# Patient Record
Sex: Female | Born: 2000 | Race: Black or African American | Hispanic: No | Marital: Single | Smoking: Current every day smoker
Health system: Southern US, Community
[De-identification: ages and names within clinical notes are randomized; demographics above are authoritative.]

---
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
## 2018-05-09 ENCOUNTER — Emergency Department
Admission: EM | Admit: 2018-05-09 | Discharge: 2018-05-09 | Disposition: A | Discharge status: Home / Self Care | Payer: Medicaid Other | Attending: Emergency Medicine | Admitting: Emergency Medicine

## 2018-05-09 ENCOUNTER — Other Ambulatory Visit: Payer: Self-pay

## 2018-05-09 ENCOUNTER — Emergency Department: Payer: Medicaid Other

## 2018-05-09 DIAGNOSIS — Y9389 Activity, other specified: Secondary | ICD-10-CM | POA: Insufficient documentation

## 2018-05-09 DIAGNOSIS — W2209XA Striking against other stationary object, initial encounter: Secondary | ICD-10-CM | POA: Insufficient documentation

## 2018-05-09 DIAGNOSIS — Y929 Unspecified place or not applicable: Secondary | ICD-10-CM | POA: Insufficient documentation

## 2018-05-09 DIAGNOSIS — Y999 Unspecified external cause status: Secondary | ICD-10-CM | POA: Insufficient documentation

## 2018-05-09 DIAGNOSIS — S60221A Contusion of right hand, initial encounter: Secondary | ICD-10-CM | POA: Insufficient documentation

## 2018-05-09 DIAGNOSIS — S60511A Abrasion of right hand, initial encounter: Secondary | ICD-10-CM | POA: Insufficient documentation

## 2018-05-09 DIAGNOSIS — F17290 Nicotine dependence, other tobacco product, uncomplicated: Secondary | ICD-10-CM | POA: Insufficient documentation

## 2018-05-09 MED ORDER — BACITRACIN-NEOMYCIN-POLYMYXIN 400-5-5000 EX OINT
TOPICAL_OINTMENT | Freq: Once | CUTANEOUS | Status: AC
  Administered 2018-05-09: 1 via TOPICAL
  Filled 2018-05-09: qty 1

## 2018-05-09 NOTE — Discharge Instructions (Signed)
Please follow-up with primary care provider of your choice for symptoms that are not improving over the next few days. Apply ice to your hand 20 minutes/h while awake. Wear the splint for comfort. Take Tylenol or ibuprofen for pain. Return to the emergency department for symptoms of change or worsen if unable to schedule appointment.

## 2018-05-09 NOTE — ED Triage Notes (Signed)
First Nurse: patient brought in by ems from home. Per ems patient punched a wall twice tonight. Patient now with pain and swelling to right hand.

## 2018-05-09 NOTE — ED Triage Notes (Signed)
Pt states she punched a wall pta. Pt with right hand swelling, pain and abrasions noted. Ice applied in triage. Cms intact to fingers.

## 2018-05-09 NOTE — ED Provider Notes (Signed)
[facility] Emergency Department Provider Note ____________________________________________  Time seen: Approximately 10:39 PM  I have reviewed the triage vital signs and the nursing notes.   HISTORY  Chief Complaint Hand Injury    HPI Joyce Newton is a 18 y.o. female who presents to the emergency department for evaluation and treatment of right hand pain.  She punched a wall prior to arrival.  Ice applied but no other alleviating measures attempted.  No past medical history on file.  There are no active problems to display for this patient.   No past surgical history on file.  Prior to Admission medications   Medication Sig Start Date End Date Taking? Authorizing Provider  amoxicillin (AMOXIL) 500 MG tablet Take 1 tablet (500 mg total) by mouth 2 (two) times daily. 11/27/16   Little, Traci M, PA-C  brompheniramine-pseudoephedrine-DM 30-2-10 MG/5ML syrup Take 5 mLs by mouth 4 (four) times daily as needed. 12/20/16   Joni Reining, PA-C    Allergies Patient has no known allergies.  No family history on file.  Social History Social History   Tobacco Use  . Smoking status: Current Every Day Smoker    Types: Cigars  Substance Use Topics  . Alcohol use: Not on file  . Drug use: Not on file    Review of Systems Constitutional: Negative for fever. Cardiovascular: Negative for chest pain. Respiratory: Negative for shortness of breath. Musculoskeletal: Positive for right hand pain. Skin: Positive for abrasions to the right hand.  Neurological: Negative for decrease in sensation  ____________________________________________   PHYSICAL EXAM:  VITAL SIGNS: ED Triage Vitals [05/09/18 2059]  Enc Vitals Group     BP 133/70     Pulse Rate 97     Resp 16     Temp 98.5 F (36.9 C)     Temp Source Oral     SpO2 100 %     Weight 294 lb (133.4 kg)     Height 5\' 11"  (1.803 m)     Head Circumference      Peak Flow      Pain Score 10     Pain  Loc      Pain Edu?      Excl. in GC?     Constitutional: Alert and oriented. Well appearing and in no acute distress. Eyes: Conjunctivae are clear without discharge or drainage Head: Atraumatic Neck: Supple Respiratory: No cough. Respirations are even and unlabored. Musculoskeletal: Diffuse right hand pain. FROM of fingers. No tenderness over the right wrist.  Neurologic: Motor and sensory function is intact.  Skin: Abrasion to the right thumb and index finger.  Psychiatric: Affect and behavior are appropriate.  ____________________________________________   LABS (all labs ordered are listed, but only abnormal results are displayed)  Labs Reviewed - No data to display ____________________________________________  RADIOLOGY  Image of the right hand is negative for acute bony abnormality per radiology. ____________________________________________   PROCEDURES  .Splint Application Date/Time: 05/10/2018 12:05 AM Performed by: Chinita Pester, FNP Authorized by: Chinita Pester, FNP   Consent:    Consent obtained:  Verbal   Consent given by:  Patient Pre-procedure details:    Sensation:  Normal Procedure details:    Laterality:  Right   Location:  Hand   Supplies:  Prefabricated splint (cock up splint) Post-procedure details:    Pain:  Unchanged   Sensation:  Normal   Patient tolerance of procedure:  Tolerated well, no immediate complications    ____________________________________________  INITIAL IMPRESSION / ASSESSMENT AND PLAN / ED COURSE  Joyce Newton is a 18 y.o. who presents to the emergency department for treatment and evaluation after punching a wall prior to arrival.  X-rays negative for acute bony abnormality.  She was encouraged to take Tylenol or ibuprofen for pain if needed.  Wound care was provided and she was advised to continue to apply Neosporin to the abrasions on her hand.  She is to follow-up with primary care provider for choice for symptoms  that are not improving over the next few days.  She was given a hand/wrist splint just for comfort.  She is to return to the emergency department for symptoms of change or worsen if unable to schedule an appointment.  Medications  neomycin-bacitracin-polymyxin (NEOSPORIN) ointment packet (1 application Topical Given 05/09/18 2300)    Pertinent labs & imaging results that were available during my care of the patient were reviewed by me and considered in my medical decision making (see chart for details).  _________________________________________   FINAL CLINICAL IMPRESSION(S) / ED DIAGNOSES  Final diagnoses:  Contusion of right hand, initial encounter  Abrasion of right hand, initial encounter    ED Discharge Orders    None       If controlled substance prescribed during this visit, 12 month history viewed on the [facility] prior to issuing an initial prescription for Schedule II or III opiod.    Chinita Pesterriplett, Leenah Seidner B, FNP 05/10/18 0008    Rockne MenghiniNorman, Anne-Caroline, MD 05/13/18 1251

## 2018-05-09 NOTE — ED Notes (Signed)
Patient transported to X-ray 

## 2019-08-31 ENCOUNTER — Encounter: Payer: Self-pay | Admitting: Emergency Medicine

## 2019-08-31 ENCOUNTER — Other Ambulatory Visit: Payer: Self-pay

## 2019-08-31 ENCOUNTER — Emergency Department
Admission: EM | Admit: 2019-08-31 | Discharge: 2019-08-31 | Disposition: A | Discharge status: Home / Self Care | Payer: Medicaid Other | Attending: Emergency Medicine | Admitting: Emergency Medicine

## 2019-08-31 ENCOUNTER — Emergency Department: Payer: Medicaid Other

## 2019-08-31 DIAGNOSIS — Z20822 Contact with and (suspected) exposure to covid-19: Secondary | ICD-10-CM | POA: Insufficient documentation

## 2019-08-31 DIAGNOSIS — R059 Cough, unspecified: Secondary | ICD-10-CM

## 2019-08-31 DIAGNOSIS — R0602 Shortness of breath: Secondary | ICD-10-CM | POA: Insufficient documentation

## 2019-08-31 DIAGNOSIS — R112 Nausea with vomiting, unspecified: Secondary | ICD-10-CM | POA: Insufficient documentation

## 2019-08-31 DIAGNOSIS — F17200 Nicotine dependence, unspecified, uncomplicated: Secondary | ICD-10-CM | POA: Insufficient documentation

## 2019-08-31 DIAGNOSIS — R05 Cough: Secondary | ICD-10-CM | POA: Insufficient documentation

## 2019-08-31 LAB — BASIC METABOLIC PANEL
Anion gap: 9 (ref 5–15)
BUN: 10 mg/dL (ref 6–20)
CO2: 24 mmol/L (ref 22–32)
Calcium: 9.1 mg/dL (ref 8.9–10.3)
Chloride: 105 mmol/L (ref 98–111)
Creatinine, Ser: 0.81 mg/dL (ref 0.44–1.00)
GFR calc Af Amer: >60 mL/min (ref >60)
GFR calc non Af Amer: >60 mL/min (ref >60)
Glucose, Bld: 87 mg/dL (ref 70–99)
Potassium: 4 mmol/L (ref 3.5–5.1)
Sodium: 138 mmol/L (ref 135–145)

## 2019-08-31 LAB — CBC
HCT: 40.1 % (ref 36.0–46.0)
Hemoglobin: 13.3 g/dL (ref 12.0–15.0)
MCH: 29.2 pg (ref 26.0–34.0)
MCHC: 33.2 g/dL (ref 30.0–36.0)
MCV: 88.1 fL (ref 80.0–100.0)
Platelets: 380 10*3/uL (ref 150–400)
RBC: 4.55 MIL/uL (ref 3.87–5.11)
RDW: 12.6 % (ref 11.5–15.5)
WBC: 7.9 10*3/uL (ref 4.0–10.5)
nRBC: 0 % (ref 0.0–0.2)

## 2019-08-31 LAB — TROPONIN I (HIGH SENSITIVITY)
Troponin I (High Sensitivity): <2 ng/L (ref <18)
Troponin I (High Sensitivity): <2 ng/L (ref <18)

## 2019-08-31 LAB — POCT PREGNANCY, URINE: Preg Test, Ur: NEGATIVE

## 2019-08-31 LAB — POC URINE PREG, ED: Preg Test, Ur: NEGATIVE

## 2019-08-31 LAB — POC SARS CORONAVIRUS 2 AG -  ED: SARS Coronavirus 2 Ag: NEGATIVE

## 2019-08-31 MED ORDER — ACETAMINOPHEN-CODEINE 120-12 MG/5ML PO SOLN
5.0000 mL | Freq: Once | ORAL | Status: AC
  Administered 2019-08-31: 5 mL via ORAL
  Filled 2019-08-31: qty 5

## 2019-08-31 MED ORDER — ONDANSETRON 4 MG PO TBDP
4.0000 mg | ORAL_TABLET | Freq: Once | ORAL | Status: AC
  Administered 2019-08-31: 4 mg via ORAL
  Filled 2019-08-31: qty 1

## 2019-08-31 MED ORDER — IBUPROFEN 600 MG PO TABS
600.0000 mg | ORAL_TABLET | Freq: Once | ORAL | Status: AC
  Administered 2019-08-31: 600 mg via ORAL
  Filled 2019-08-31: qty 1

## 2019-08-31 MED ORDER — GUAIFENESIN-DM 100-10 MG/5ML PO SYRP
5.0000 mL | ORAL_SOLUTION | ORAL | 0 refills | Status: DC | PRN
Start: 2019-08-31 — End: 2021-01-01

## 2019-08-31 MED ORDER — BENZONATATE 100 MG PO CAPS
100.0000 mg | ORAL_CAPSULE | Freq: Once | ORAL | Status: AC
  Administered 2019-08-31: 100 mg via ORAL
  Filled 2019-08-31: qty 1

## 2019-08-31 MED ORDER — BENZONATATE 100 MG PO CAPS
100.0000 mg | ORAL_CAPSULE | Freq: Three times a day (TID) | ORAL | 0 refills | Status: AC | PRN
Start: 2019-08-31 — End: 2020-08-30

## 2019-08-31 NOTE — ED Notes (Signed)
POC SARS RESULT WAS NEGATIVE

## 2019-08-31 NOTE — ED Triage Notes (Signed)
Pt reports started yesterday with chest pain; reports when BP elevated normally has chest pain.  Also having cough, SHOB, and headache. Pt feels the headache is from the cough but unsure if chest pain started or after the cough.  Chest pain is intermittent and sharp to left side of chest.

## 2019-08-31 NOTE — ED Provider Notes (Signed)
[facility] Emergency Department Provider Note  ____________________________________________  Time seen: Approximately 7:48 PM  I have reviewed the triage vital signs and the nursing notes.   HISTORY  Chief Complaint Chest Pain, Headache, and Cough    HPI Joyce Newton is a 19 y.o. female with no significant past medical history that presents to the emergency department for evaluation of headache, nonproductive cough, shortness of breath, vomiting for 2 days.  Patient states that she had a sore throat yesterday but this improved today.  She had some chest pain earlier this morning but none currently.  Patient has not had anything to eat today because when she tries to eat, she feels nauseous and vomits.  She has been able to drink fluids.  No sick contacts.  She has not received her Covid vaccinations.  Patient is not on any hormonal birth control.  No pleuritic chest pain, abdominal pain, diarrhea.   History reviewed. No pertinent past medical history.  There are no problems to display for this patient.   History reviewed. No pertinent surgical history.  Prior to Admission medications   Medication Sig Start Date End Date Taking? Authorizing Provider  benzonatate (TESSALON PERLES) 100 MG capsule Take 1 capsule (100 mg total) by mouth 3 (three) times daily as needed. 08/31/19 08/30/20  Laban Emperor, PA-C  guaiFENesin-dextromethorphan (ROBITUSSIN DM) 100-10 MG/5ML syrup Take 5 mLs by mouth every 4 (four) hours as needed for cough. 08/31/19   Laban Emperor, PA-C    Allergies Patient has no known allergies.  History reviewed. No pertinent family history.  Social History Social History   Tobacco Use  . Smoking status: Current Every Day Smoker  . Smokeless tobacco: Never Used  Substance Use Topics  . Alcohol use: Never  . Drug use: Never     Review of Systems  Constitutional: No fever/chills Eyes: No visual changes. No discharge. ENT: Negatvie for  congestion and rhinorrhea. Positive for sore throat yesterday. Cardiovascular: No current chest pain. Respiratory: Positive for cough. No current SOB. Gastrointestinal: No abdominal pain.  Positive for nausea and vomiting.  No diarrhea.  No constipation. Musculoskeletal: Negative for musculoskeletal pain. Skin: Negative for rash, abrasions, lacerations, ecchymosis. Neurological: Positive for headache.   ____________________________________________   PHYSICAL EXAM:  VITAL SIGNS: ED Triage Vitals  Enc Vitals Group     BP 08/31/19 1722 128/63     Pulse Rate 08/31/19 1722 100     Resp 08/31/19 1722 20     Temp 08/31/19 1722 98.7 F (37.1 C)     Temp Source 08/31/19 1722 Oral     SpO2 08/31/19 1722 95 %     Weight 08/31/19 1713 (!) 320 lb (145.2 kg)     Height 08/31/19 1713 5\' 10"  (1.778 m)     Head Circumference --      Peak Flow --      Pain Score 08/31/19 1713 0     Pain Loc --      Pain Edu? --      Excl. in [place]? --      Constitutional: Alert and oriented. Well appearing and in no acute distress. Eyes: Conjunctivae are normal. PERRL. EOMI. No discharge. Head: Atraumatic. ENT: No frontal and maxillary sinus tenderness.      Ears: Tympanic membranes pearly gray with good landmarks. No discharge.      Nose: No congestion/rhinnorhea.      Mouth/Throat: Mucous membranes are moist. Oropharynx non-erythematous. Tonsils not enlarged. No exudates. Uvula midline. Neck: No  stridor.   Hematological/Lymphatic/Immunilogical: No cervical lymphadenopathy. Cardiovascular: Normal rate, regular rhythm.  Good peripheral circulation. Respiratory: Active dry nonproductive cough.  Normal respiratory effort without tachypnea or retractions. Lungs CTAB. Good air entry to the bases with no decreased or absent breath sounds. Gastrointestinal: Bowel sounds 4 quadrants. Soft and nontender to palpation. No guarding or rigidity. No palpable masses. No distention. Musculoskeletal: Full range of motion  to all extremities. No gross deformities appreciated. Neurologic:  Normal speech and language. No gross focal neurologic deficits are appreciated.  Skin:  Skin is warm, dry and intact. No rash noted. Psychiatric: Mood and affect are normal. Speech and behavior are normal. Patient exhibits appropriate insight and judgement.   ____________________________________________   LABS (all labs ordered are listed, but only abnormal results are displayed)  Labs Reviewed  POCT PREGNANCY, URINE - Abnormal; Notable for the following components:      Result Value   Preg Test, Ur POSITIVE (*)    All other components within normal limits  SARS CORONAVIRUS 2 (TAT 6-24 HRS)  BASIC METABOLIC PANEL  CBC  POC URINE PREG, ED  POC SARS CORONAVIRUS 2 AG -  ED  POCT PREGNANCY, URINE  TROPONIN I (HIGH SENSITIVITY)  TROPONIN I (HIGH SENSITIVITY)   ____________________________________________  EKG  ST ____________________________________________  RADIOLOGY Lexine Baton, personally viewed and evaluated these images (plain radiographs) as part of my medical decision making, as well as reviewing the written report by the radiologist.  DG Chest 2 View  Result Date: 08/31/2019 CLINICAL DATA:  19 year old female with chest pain and shortness of breath. EXAM: CHEST - 2 VIEW COMPARISON:  None. FINDINGS: The heart size and mediastinal contours are within normal limits. Both lungs are clear. The visualized skeletal structures are unremarkable. IMPRESSION: No active cardiopulmonary disease. Electronically Signed   By: Elgie Collard M.D.   On: 08/31/2019 17:55    ____________________________________________    PROCEDURES  Procedure(s) performed:    Procedures    Medications  acetaminophen-codeine 120-12 MG/5ML solution 5 mL (5 mLs Oral Given 08/31/19 2013)  ondansetron (ZOFRAN-ODT) disintegrating tablet 4 mg (4 mg Oral Given 08/31/19 2013)  benzonatate (TESSALON) capsule 100 mg (100 mg Oral  Given 08/31/19 2226)  ibuprofen (ADVIL) tablet 600 mg (600 mg Oral Given 08/31/19 2226)     ____________________________________________   INITIAL IMPRESSION / ASSESSMENT AND PLAN / ED COURSE  Pertinent labs & imaging results that were available during my care of the patient were reviewed by me and considered in my medical decision making (see chart for details).  Review of the [place] CSRS was performed in accordance of the [facility] prior to dispensing any controlled drugs.   Patient presented to the emergency department for evaluation of viral URI symptoms. Vital signs and exam are reassuring.  Lab work unremarkable.  Troponin within normal limits.  Chest x-ray negative for acute cardiopulmonary processes.  Sinus tachycardia on EKG. Oxygen saturations 95-99%. Patient was given Tylenol 3, Zofran in the emergency department with improvement of symptoms.  Patient denies any shortness of breath or chest pain while roomed in the ER.  Patient appears well and is staying well hydrated. Patient feels comfortable going home. Patient will be discharged home with prescriptions for tessalon perles and robitussin. Patient is to follow up with PCP as needed or otherwise directed. Patient is given ED precautions to return to the ED for any worsening or new symptoms.   Lucresia Simic was evaluated in Emergency Department on 08/31/2019 for the symptoms described in the  history of present illness. She was evaluated in the context of the global COVID-19 pandemic, which necessitated consideration that the patient might be at risk for infection with the SARS-CoV-2 virus that causes COVID-19. Institutional protocols and algorithms that pertain to the evaluation of patients at risk for COVID-19 are in a state of rapid change based on information released by regulatory bodies including the CDC and federal and state organizations. These policies and algorithms were followed during the patient's care in the  ED.  ____________________________________________  FINAL CLINICAL IMPRESSION(S) / ED DIAGNOSES  Final diagnoses:  Cough      NEW MEDICATIONS STARTED DURING THIS VISIT:  ED Discharge Orders         Ordered    benzonatate (TESSALON PERLES) 100 MG capsule  3 times daily PRN     08/31/19 2204    guaiFENesin-dextromethorphan (ROBITUSSIN DM) 100-10 MG/5ML syrup  Every 4 hours PRN     08/31/19 2204              This chart was dictated using voice recognition software/Dragon. Despite best efforts to proofread, errors can occur which can change the meaning. Any change was purely unintentional.    Enid Derry, PA-C 08/31/19 2322    Emily Filbert, MD 09/01/19 [phone]

## 2019-08-31 NOTE — ED Notes (Signed)
Pt is NEGATIVE for POC. Test results were entered invalid

## 2019-09-01 LAB — SARS CORONAVIRUS 2 (TAT 6-24 HRS): SARS Coronavirus 2: NEGATIVE

## 2019-09-01 LAB — POCT PREGNANCY, URINE: Preg Test, Ur: NEGATIVE

## 2019-11-06 ENCOUNTER — Encounter: Payer: Self-pay | Admitting: Emergency Medicine

## 2020-04-16 ENCOUNTER — Other Ambulatory Visit: Payer: Self-pay

## 2020-04-16 ENCOUNTER — Encounter: Payer: Self-pay | Admitting: Emergency Medicine

## 2020-04-16 ENCOUNTER — Emergency Department
Admission: EM | Admit: 2020-04-16 | Discharge: 2020-04-16 | Disposition: A | Discharge status: Home / Self Care | Payer: Medicaid Other | Attending: Emergency Medicine | Admitting: Emergency Medicine

## 2020-04-16 DIAGNOSIS — F17290 Nicotine dependence, other tobacco product, uncomplicated: Secondary | ICD-10-CM | POA: Insufficient documentation

## 2020-04-16 DIAGNOSIS — R1310 Dysphagia, unspecified: Secondary | ICD-10-CM | POA: Insufficient documentation

## 2020-04-16 DIAGNOSIS — Z20822 Contact with and (suspected) exposure to covid-19: Secondary | ICD-10-CM | POA: Insufficient documentation

## 2020-04-16 DIAGNOSIS — J029 Acute pharyngitis, unspecified: Secondary | ICD-10-CM | POA: Insufficient documentation

## 2020-04-16 LAB — RESP PANEL BY RT-PCR (FLU A&B, COVID) ARPGX2
Influenza A by PCR: NEGATIVE
Influenza B by PCR: NEGATIVE
SARS Coronavirus 2 by RT PCR: NEGATIVE

## 2020-04-16 LAB — GROUP A STREP BY PCR: Group A Strep by PCR: NOT DETECTED

## 2020-04-16 NOTE — ED Triage Notes (Signed)
Pt reports sore throat for the past week. Pt denies fevers and all other sx's.

## 2020-04-16 NOTE — Discharge Instructions (Signed)
You tested negative for COVID-19 and strep throat.  You likely have some other viral cause of your sore throat.  Continue your medications at home, return to the ED with any fevers, difficulty speaking or swallowing.

## 2020-04-16 NOTE — ED Notes (Signed)
Results reviewed with patient. Work note given.

## 2020-04-16 NOTE — ED Provider Notes (Signed)
[facility] Emergency Department Provider Note ____________________________________________   Event Date/Time   First MD Initiated Contact with Patient 04/16/20 1658     (approximate)  I have reviewed the triage vital signs and the nursing notes.  HISTORY  Chief Complaint Sore Throat   HPI Joyce Newton is a 20 y.o. femalewho presents to the ED for evaluation of sore throat.  Chart review indicates no relevant medical history. Patient reports her LMP finished 1 week ago.  Patient just to the ED with about 1 week of intermittent sore throat. She reports improving symptoms with DayQuil and NyQuil, but symptoms return after these medications wear off. She reports some mild odynophagia, but denies any additional symptoms. Denies voice changes, difficulty swallowing, shortness of breath, respiratory congestion, cough, chest pain, syncope, emesis or diarrhea. No recent antibiotics.   History reviewed. No pertinent past medical history.  There are no problems to display for this patient.   History reviewed. No pertinent surgical history.  Prior to Admission medications   Medication Sig Start Date End Date Taking? Authorizing Provider  amoxicillin (AMOXIL) 500 MG tablet Take 1 tablet (500 mg total) by mouth 2 (two) times daily. 11/27/16   Little, Traci M, PA-C  benzonatate (TESSALON PERLES) 100 MG capsule Take 1 capsule (100 mg total) by mouth 3 (three) times daily as needed. 08/31/19 08/30/20  Enid Derry, PA-C  brompheniramine-pseudoephedrine-DM 30-2-10 MG/5ML syrup Take 5 mLs by mouth 4 (four) times daily as needed. 12/20/16   Joni Reining, PA-C  guaiFENesin-dextromethorphan (ROBITUSSIN DM) 100-10 MG/5ML syrup Take 5 mLs by mouth every 4 (four) hours as needed for cough. 08/31/19   Enid Derry, PA-C    Allergies Patient has no known allergies.  No family history on file.  Social History Social History   Tobacco Use  . Smoking status: Current  Every Day Smoker    Types: Cigars  . Smokeless tobacco: Never Used  Substance Use Topics  . Alcohol use: Never  . Drug use: Never    Review of Systems  Constitutional: No fever/chills Eyes: No visual changes. ENT: No sore throat. Cardiovascular: Denies chest pain. Respiratory: Denies shortness of breath. Gastrointestinal: No abdominal pain.  No nausea, no vomiting.  No diarrhea.  No constipation. Genitourinary: Negative for dysuria. Musculoskeletal: Negative for back pain. Skin: Negative for rash. Neurological: Negative for headaches, focal weakness or numbness.  ____________________________________________   PHYSICAL EXAM:  VITAL SIGNS: Vitals:   04/16/20 1512  BP: 131/81  Pulse: 77  Resp: 17  Temp: 98.4 F (36.9 C)  SpO2: 100%     Constitutional: Alert and oriented. Well appearing and in no acute distress. Phonating without difficulty with a normal voice. Eyes: Conjunctivae are normal. PERRL. EOMI. Head: Atraumatic. Nose: No congestion/rhinnorhea. Mouth/Throat: Mucous membranes are moist. Mild and diffuse posterior oropharyngeal erythema. Uvula is midline. Neck: No stridor. No cervical spine tenderness to palpation. Cardiovascular: Normal rate, regular rhythm. Grossly normal heart sounds.  Good peripheral circulation. Respiratory: Normal respiratory effort.  No retractions. Lungs CTAB. Gastrointestinal: Soft , nondistended, nontender to palpation. No CVA tenderness. Musculoskeletal: No lower extremity tenderness nor edema.  No joint effusions. No signs of acute trauma. Neurologic:  Normal speech and language. No gross focal neurologic deficits are appreciated. No gait instability noted. Skin:  Skin is warm, dry and intact. No rash noted. Psychiatric: Mood and affect are normal. Speech and behavior are normal.  ____________________________________________   LABS (all labs ordered are listed, but only abnormal results are displayed)  Labs Reviewed  RESP PANEL  BY RT-PCR (FLU A&B, COVID) ARPGX2  GROUP A STREP BY PCR    ____________________________________________   PROCEDURES and INTERVENTIONS  Procedure(s) performed (including Critical Care):  Procedures  Medications - No data to display  ____________________________________________   MDM / ED COURSE   Otherwise healthy 20 year old female presents to the ED with sore throat and mild odynophagia, requiring strep and COVID swabbing for outpatient management. Normal vitals on room air. Exam without evidence of peritonsillar abscess, pneumonia, distress or any trauma. She looks well and is conversational and in normal voice. We will swab for strep to ensure no need for antibiotics, also swab for COVID-19. Anticipate outpatient management regardless of these test, plus or minus antibiotics. Return precautions for the ED were discussed.      ____________________________________________   FINAL CLINICAL IMPRESSION(S) / ED DIAGNOSES  Final diagnoses:  Sore throat     ED Discharge Orders    None       Ayonna Speranza Katrinka Blazing   Note:  This document was prepared using Dragon voice recognition software and may include unintentional dictation errors.   Delton Prairie, MD 04/16/20 [phone]

## 2021-01-01 ENCOUNTER — Emergency Department
Admission: EM | Admit: 2021-01-01 | Discharge: 2021-01-01 | Disposition: A | Discharge status: Home / Self Care | Payer: Medicaid Other | Attending: Internal Medicine | Admitting: Internal Medicine

## 2021-01-01 ENCOUNTER — Other Ambulatory Visit: Payer: Self-pay

## 2021-01-01 DIAGNOSIS — L03011 Cellulitis of right finger: Secondary | ICD-10-CM | POA: Insufficient documentation

## 2021-01-01 DIAGNOSIS — F17210 Nicotine dependence, cigarettes, uncomplicated: Secondary | ICD-10-CM | POA: Insufficient documentation

## 2021-01-01 MED ORDER — CEPHALEXIN 500 MG PO CAPS: 500.0000 mg | ORAL_CAPSULE | Freq: Three times a day (TID) | ORAL | 0 refills | Status: AC

## 2021-01-01 MED ORDER — CEPHALEXIN 500 MG PO CAPS: 500.0000 mg | ORAL_CAPSULE | Freq: Three times a day (TID) | ORAL | 0 refills | Status: DC

## 2021-01-01 NOTE — ED Triage Notes (Signed)
Pt come with c/o right ring finger redness and swelling. Pt states she noticed it 3 days ago. Pt denies any known injuries or bites.

## 2021-01-01 NOTE — ED Provider Notes (Signed)
[facility] Emergency Department Provider Note ____________________________________________  Time seen: 0745  I have reviewed the triage vital signs and the nursing notes.  HISTORY  Chief Complaint  Finger Injury   HPI Joyce Newton is a 20 y.o. female presents to the ER today with complaint of right fourth finger pain.  She reports this started 3 days ago.  She describes the pain as throbbing.  She reports some associated swelling but denies numbness, tingling or weakness.  She denies any injury to the area.  She denies fever, chills.  She has not tried anything OTC for this.  History reviewed. No pertinent past medical history.  There are no problems to display for this patient.   History reviewed. No pertinent surgical history.  Prior to Admission medications   Medication Sig Start Date End Date Taking? Authorizing Provider  cephALEXin (KEFLEX) 500 MG capsule Take 1 capsule (500 mg total) by mouth 3 (three) times daily for 10 days. 01/01/21 01/11/21 Yes BaitySalvadore Oxford, NP    Allergies Patient has no known allergies.  No family history on file.  Social History Social History   Tobacco Use   Smoking status: Every Day    Types: Cigars   Smokeless tobacco: Never  Substance Use Topics   Alcohol use: Never   Drug use: Never    Review of Systems  Constitutional: Negative for fever, chills. Cardiovascular: Negative for chest pain or chest tightness. Respiratory: Negative for cough or shortness of breath. Musculoskeletal: Positive for right ring finger pain and swelling.  Negative for wrist or elbow pain. Skin: Positive for redness around the nailbed of the right ring finger.  Negative for rash. Neurological: Negative for focal weakness, tingling or numbness. ____________________________________________  PHYSICAL EXAM:  VITAL SIGNS: ED Triage Vitals  Enc Vitals Group     BP 01/01/21 0720 134/86     Pulse Rate 01/01/21 0720 99     Resp  01/01/21 0720 18     Temp 01/01/21 0720 98 F (36.7 C)     Temp src --      SpO2 01/01/21 0720 95 %     Weight --      Height --      Head Circumference --      Peak Flow --      Pain Score 01/01/21 0718 8     Pain Loc --      Pain Edu? --      Excl. in [place]? --     Constitutional: Alert and oriented.  Obese, in no distress. Head: Normocephalic. Eyes:  Normal extraocular movements Cardiovascular: Normal rate, regular rhythm.  Radial pulses 2+ on the right. Respiratory: Normal respiratory effort. No wheezes/rales/rhonchi. Musculoskeletal: Normal flexion and extension of the right ring finger.  1+ swelling noted of the right ring finger. Neurologic: Sensation intact to right ring finger. Skin: Redness and pus noted around the lateral nailbed of the right ring finger.  ____________________________________________  PROCEDURES  .Marland KitchenIncision and Drainage  Date/Time: 01/01/2021 8:03 AM Performed by: Lorre Munroe, NP Authorized by: Lorre Munroe, NP   Consent:    Consent obtained:  Verbal   Consent given by:  Patient   Risks, benefits, and alternatives were discussed: yes     Risks discussed:  Bleeding, incomplete drainage, pain and infection   Alternatives discussed:  No treatment Universal protocol:    Immediately prior to procedure, a time out was called: yes     Patient identity confirmed:  Verbally  with patient and arm band Location:    Indications for incision and drainage: paronychia.   Location:  Upper extremity   Upper extremity location:  Finger   Finger location:  R ring finger Pre-procedure details:    Skin preparation:  Povidone-iodine Sedation:    Sedation type:  None Anesthesia:    Anesthesia method:  Topical application Procedure type:    Complexity:  Simple Procedure details:    Ultrasound guidance: no     Needle aspiration: yes     Needle size:  18 G   Drainage:  Purulent   Drainage amount:  Scant   Wound treatment:  Wound left  open Post-procedure details:    Procedure completion:  Tolerated well, no immediate complications  ____________________________________________  INITIAL IMPRESSION / ASSESSMENT AND PLAN / ED COURSE  Paronychia of Right Ring Finger:  Needle aspiration of paronychia right ring finger performed by this provider Rx for Keflex 500 mg 3 times a day x7 days May take Ibuprofen 400 mg every 8 hours as needed for pain and inflammation ____________________________________________  FINAL CLINICAL IMPRESSION(S) / ED DIAGNOSES  Final diagnoses:  Paronychia of finger, right     Lorre Munroe, NP 01/01/21 0805    Gilles Chiquito, MD 01/01/21 1521

## 2021-01-01 NOTE — Discharge Instructions (Addendum)
You were seen today for a paronychia.  This is infection around it in the nailbed.  I am putting you on antibiotics 3 times daily for the next 7 days.  You may take ibuprofen as needed for pain and inflammation.

## 2021-09-14 IMAGING — CR DG CHEST 2V
1 series · 2 of 2 positions shown · non-contrast
Comparison: None.

CLINICAL DATA: 18-year-old female with chest pain and shortness of
breath.

EXAM:
CHEST - 2 VIEW

[Series 1: dg chest 2 view · 0.14mm/px · 2 of 2 slices shown]
[im 1/2]
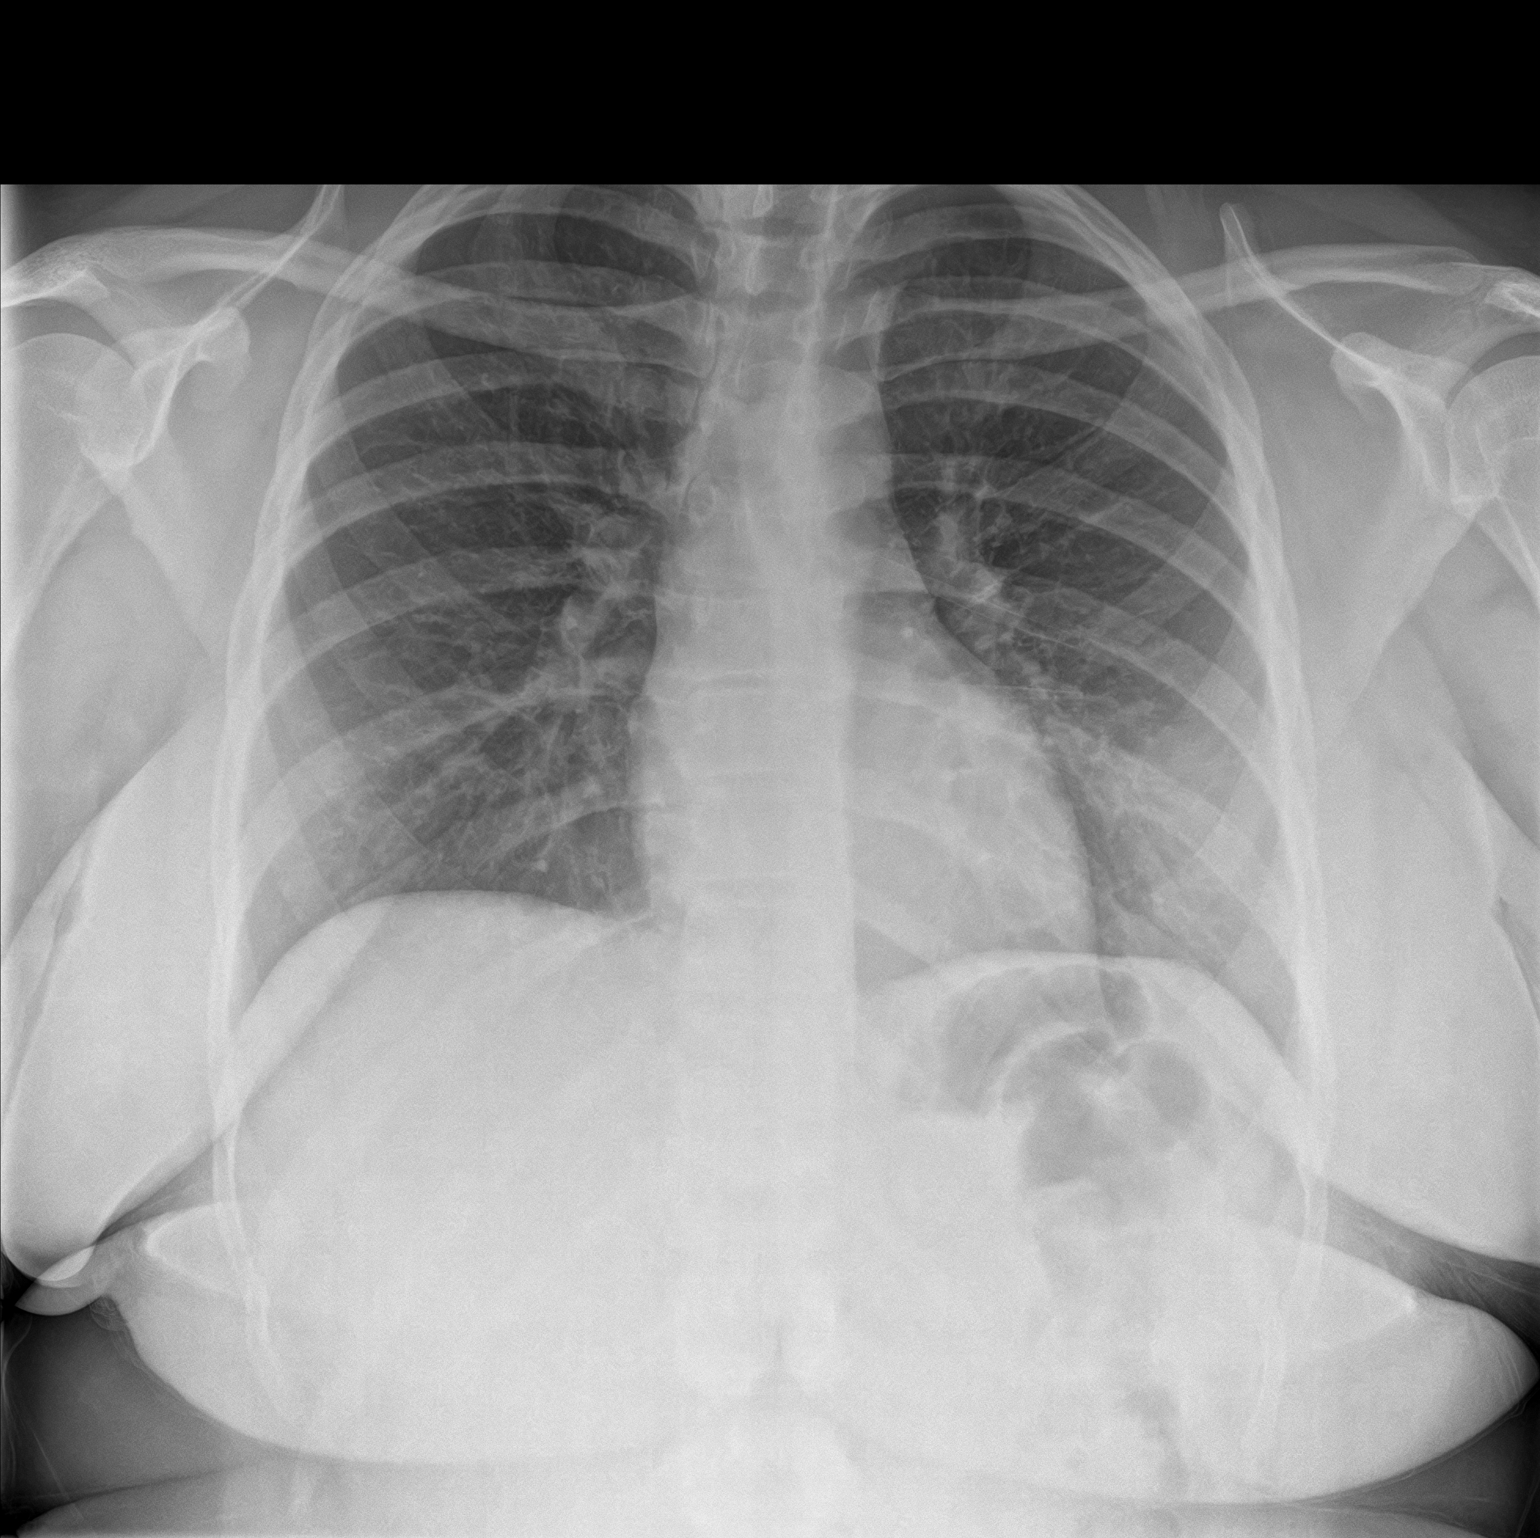
[im 2/2]
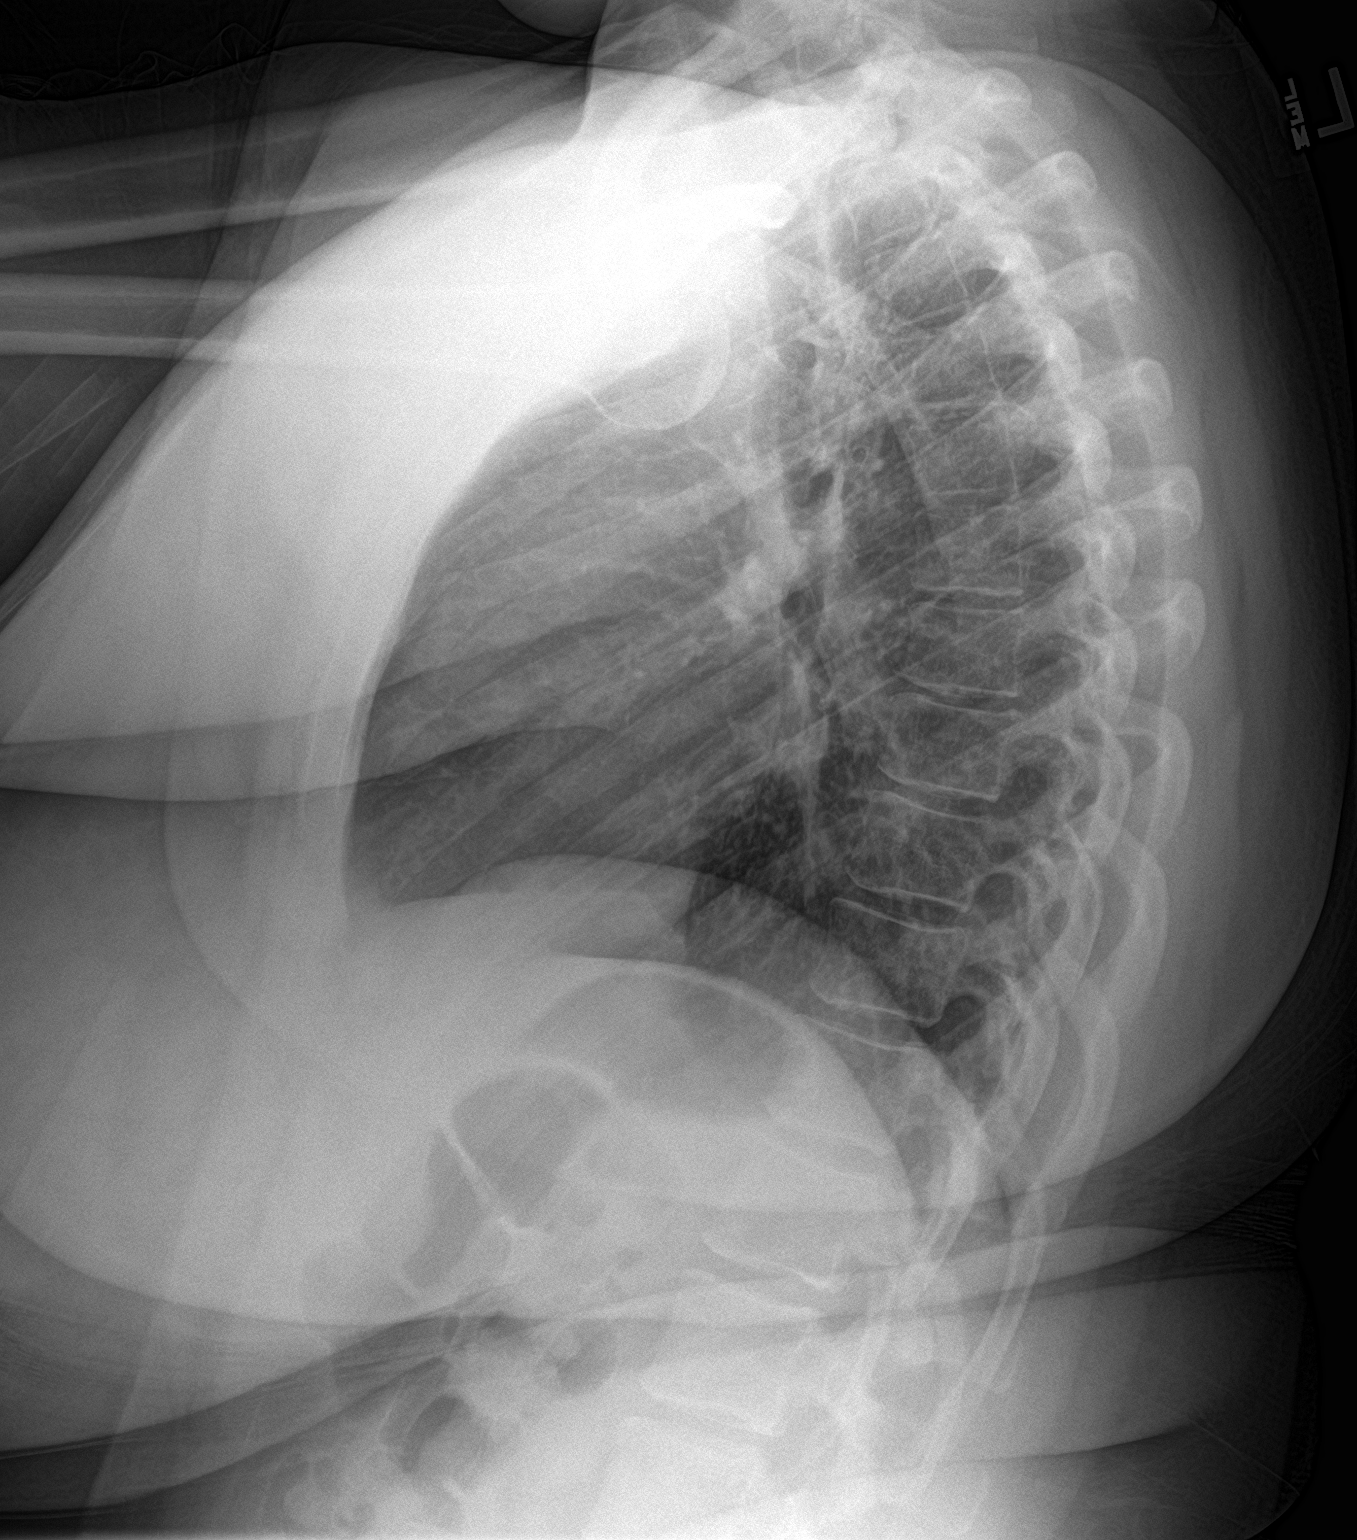

[2 of 2 positions shown; findings below may reference images not displayed]

FINDINGS: The heart size and mediastinal contours are within normal limits.
Both lungs are clear. The visualized skeletal structures are
unremarkable.
IMPRESSION: No active cardiopulmonary disease.
